# Patient Record
Sex: Female | Born: 1990 | Hispanic: Yes | Marital: Single | Smoking: Current every day smoker
Health system: Southern US, Community
[De-identification: ages and names within clinical notes are randomized; demographics above are authoritative.]

---

## 2021-04-21 ENCOUNTER — Other Ambulatory Visit: Payer: Self-pay

## 2021-04-21 ENCOUNTER — Encounter: Payer: Self-pay | Admitting: Emergency Medicine

## 2021-04-21 DIAGNOSIS — F17210 Nicotine dependence, cigarettes, uncomplicated: Secondary | ICD-10-CM | POA: Insufficient documentation

## 2021-04-21 DIAGNOSIS — L03115 Cellulitis of right lower limb: Secondary | ICD-10-CM | POA: Insufficient documentation

## 2021-04-21 DIAGNOSIS — D649 Anemia, unspecified: Secondary | ICD-10-CM | POA: Insufficient documentation

## 2021-04-21 LAB — BASIC METABOLIC PANEL
Anion gap: 7 (ref 5–15)
BUN: 17 mg/dL (ref 6–20)
CO2: 27 mmol/L (ref 22–32)
Calcium: 9 mg/dL (ref 8.9–10.3)
Chloride: 102 mmol/L (ref 98–111)
Creatinine, Ser: 0.71 mg/dL (ref 0.44–1.00)
GFR, Estimated: >60 mL/min (ref >60)
Glucose, Bld: 108 mg/dL — ABNORMAL HIGH (ref 70–99)
Potassium: 4.2 mmol/L (ref 3.5–5.1)
Sodium: 136 mmol/L (ref 135–145)

## 2021-04-21 LAB — CBC
HCT: 26.1 % — ABNORMAL LOW (ref 36.0–46.0)
Hemoglobin: 7.1 g/dL — ABNORMAL LOW (ref 12.0–15.0)
MCH: 16.4 pg — ABNORMAL LOW (ref 26.0–34.0)
MCHC: 27.2 g/dL — ABNORMAL LOW (ref 30.0–36.0)
MCV: 60.4 fL — ABNORMAL LOW (ref 80.0–100.0)
Platelets: 302 10*3/uL (ref 150–400)
RBC: 4.32 MIL/uL (ref 3.87–5.11)
RDW: 18.6 % — ABNORMAL HIGH (ref 11.5–15.5)
WBC: 6.7 10*3/uL (ref 4.0–10.5)
nRBC: 0 % (ref 0.0–0.2)

## 2021-04-21 NOTE — ED Triage Notes (Signed)
Pt to ED from home c/o right leg swelling x4 days.  States redness but dry.  States swelling to bilateral legs in the past.  Right leg more swollen than left and redness noted to lower leg.

## 2021-04-22 ENCOUNTER — Emergency Department: Payer: Self-pay

## 2021-04-22 ENCOUNTER — Emergency Department
Admission: EM | Admit: 2021-04-22 | Discharge: 2021-04-22 | Disposition: A | Discharge status: Home / Self Care | Payer: Self-pay | Attending: Emergency Medicine | Admitting: Emergency Medicine

## 2021-04-22 DIAGNOSIS — M7989 Other specified soft tissue disorders: Secondary | ICD-10-CM

## 2021-04-22 DIAGNOSIS — D649 Anemia, unspecified: Secondary | ICD-10-CM

## 2021-04-22 DIAGNOSIS — L03115 Cellulitis of right lower limb: Secondary | ICD-10-CM

## 2021-04-22 LAB — LACTIC ACID, PLASMA: Lactic Acid, Venous: 0.7 mmol/L (ref 0.5–1.9)

## 2021-04-22 MED ORDER — SODIUM CHLORIDE 0.9 % IV SOLN
100.0000 mg | Freq: Once | INTRAVENOUS | Status: AC
  Administered 2021-04-22: 04:00:00 100 mg via INTRAVENOUS
  Filled 2021-04-22 (×2): qty 100

## 2021-04-22 MED ORDER — DOXYCYCLINE HYCLATE 50 MG PO CAPS: 100.0000 mg | ORAL_CAPSULE | Freq: Two times a day (BID) | ORAL | 0 refills | Status: AC

## 2021-04-22 NOTE — ED Provider Notes (Signed)
[facility] Emergency Department Provider Note   ____________________________________________   Event Date/Time   First MD Initiated Contact with Patient 04/22/21 0100     (approximate)  I have reviewed the triage vital signs and the nursing notes.   HISTORY  Chief Complaint Leg Swelling    HPI Stacy Mullen is a 30 y.o. female who presents to the ED from home with a chief complaint of right leg redness and swelling x4 days.  Denies fever, chills, cough, chest pain, shortness of breath, abdominal pain, nausea, vomiting or dizziness.  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  She last injected yesterday morning to her right posterior thigh.  States redness began beneath her big toe 4 days ago.  Thinks either something bit her or maybe she dropped something on it but denies known trauma or injury.  Redness and swelling then started in her foot and spread upwards towards her calf.      Past medical history None  There are no problems to display for this patient.   History reviewed. No pertinent surgical history.  Prior to Admission medications   Medication Sig Start Date End Date Taking? Authorizing Provider  doxycycline (VIBRAMYCIN) 50 MG capsule Take 2 capsules (100 mg total) by mouth 2 (two) times daily. 04/22/21  Yes Irean Hong, MD    Allergies Patient has no known allergies.  History reviewed. No pertinent family history.  Social History Social History   Tobacco Use   Smoking status: Every Day    Packs/day: 0.50    Types: Cigarettes   Smokeless tobacco: Never  Substance Use Topics   Alcohol use: Never   Drug use: Yes    Types: IV    Comment: Heroin last used morning 04/21/21    Review of Systems  Constitutional: No fever/chills Eyes: No visual changes. ENT: No sore throat. Cardiovascular: Denies chest pain. Respiratory: Denies shortness of breath. Gastrointestinal: No abdominal pain.  No  nausea, no vomiting.  No diarrhea.  No constipation. Genitourinary: Negative for dysuria. Musculoskeletal: Positive for right leg swelling and redness.  Negative for back pain. Skin: Negative for rash. Neurological: Negative for headaches, focal weakness or numbness.   ____________________________________________   PHYSICAL EXAM:  VITAL SIGNS: ED Triage Vitals  Enc Vitals Group     BP 04/21/21 2225 133/76     Pulse Rate 04/21/21 2225 (!) 115     Resp 04/21/21 2225 16     Temp 04/21/21 2225 98.5 F (36.9 C)     Temp Source 04/21/21 2225 Oral     SpO2 04/21/21 2225 100 %     Weight 04/21/21 2227 220 lb (99.8 kg)     Height 04/21/21 2227 5\' 7"  (1.702 m)     Head Circumference --      Peak Flow --      Pain Score 04/21/21 2227 8     Pain Loc --      Pain Edu? --      Excl. in GC? --     Constitutional: Alert and oriented. Well appearing and in no acute distress. Eyes: Conjunctivae are normal. PERRL. EOMI. Head: Atraumatic. Nose: No congestion/rhinnorhea. Mouth/Throat: Mucous membranes are moist.   Neck: No stridor.   Cardiovascular: Normal rate, regular rhythm. Grossly normal heart sounds.  Good peripheral circulation. Respiratory: Normal respiratory effort.  No retractions. Lungs CTAB. Gastrointestinal: Soft and nontender to light or deep palpation. No distention. No abdominal bruits. No  CVA tenderness. Musculoskeletal:  RLE: Small contusion noted to dorsal first metatarsal without deformity.  Redness and swelling to right dorsal foot and calf.  2+ distal pulses.  Brisk, less than 5-second capillary refill.  Right posterior thigh near bend of knee with bruising from IVDA but no fluctuance concerning for abscess. Neurologic:  Normal speech and language. No gross focal neurologic deficits are appreciated. No gait instability. Skin:  Skin is warm, dry and intact. No rash noted. Psychiatric: Mood and affect are normal. Speech and behavior are  normal.  ____________________________________________   LABS (all labs ordered are listed, but only abnormal results are displayed)  Labs Reviewed  CBC - Abnormal; Notable for the following components:      Result Value   Hemoglobin 7.1 (*)    HCT 26.1 (*)    MCV 60.4 (*)    MCH 16.4 (*)    MCHC 27.2 (*)    RDW 18.6 (*)    All other components within normal limits  BASIC METABOLIC PANEL - Abnormal; Notable for the following components:   Glucose, Bld 108 (*)    All other components within normal limits  CULTURE, BLOOD (ROUTINE X 2)  CULTURE, BLOOD (ROUTINE X 2)  LACTIC ACID, PLASMA   ____________________________________________  EKG  None ____________________________________________  RADIOLOGY I, Marina Boerner J, personally viewed and evaluated these images (plain radiographs) as part of my medical decision making, as well as reviewing the written report by the radiologist.  ED MD interpretation: No osseous injury to right foot; negative DVT US  Official radiology report(s): US Venous Img Lower Unilateral Right  Result Date: 04/22/2021 CLINICAL DATA:  Right leg swelling EXAM: RIGHT LOWER EXTREMITY VENOUS DOPPLER ULTRASOUND TECHNIQUE: Gray-scale sonography with compression, as well as color and duplex ultrasound, were performed to evaluate the deep venous system(s) from the level of the common femoral vein through the popliteal and proximal calf veins. COMPARISON:  None. FINDINGS: VENOUS Normal compressibility of the common femoral, superficial femoral, and popliteal veins, as well as the visualized calf veins. Visualized portions of profunda femoral vein and great saphenous vein unremarkable. No filling defects to suggest DVT on grayscale or color Doppler imaging. Doppler waveforms show normal direction of venous flow, normal respiratory plasticity and response to augmentation. Limited views of the contralateral common femoral vein are unremarkable. OTHER None. Limitations: none  IMPRESSION: Negative. Electronically Signed   By: Helyn Numbers M.D.   On: 04/22/2021 03:44   DG Foot Complete Right  Result Date: 04/22/2021 CLINICAL DATA:  Bruising of the first metatarsal. EXAM: RIGHT FOOT COMPLETE - 3+ VIEW COMPARISON:  None. FINDINGS: There is no evidence of fracture or dislocation. Mild to moderate severity degenerative changes are seen along the dorsal aspect of the mid right foot. Marked severity soft tissue swelling is seen, most notably along the dorsal aspect of the right foot. IMPRESSION: Marked severity soft tissue swelling without evidence of acute fracture. Electronically Signed   By: Aram Candela M.D.   On: 04/22/2021 02:33    ____________________________________________   PROCEDURES  Procedure(s) performed (including Critical Care):  Procedures   ____________________________________________   INITIAL IMPRESSION / ASSESSMENT AND PLAN / ED COURSE  As part of my medical decision making, I reviewed the following data within the electronic MEDICAL RECORD NUMBER Nursing notes reviewed and incorporated, old records reviewed, labs reviewed, and Notes from prior ED visits     30 year old female presenting with a 4-day history of right lower leg redness and swelling.  History of IVDA.  Differential diagnosis includes but is not limited to abscess, cellulitis, DVT, etc.  Laboratory results notable for anemia.  Patient has known anemia and is supposed to take iron tablets but does not.  States she was last seen in [place] and referred to a hematologist but she never followed up.  There are no old records for comparison of prior H/H and patient does not know this.  There is no active hemorrhage currently; suspect this is chronic anemia and will refer to hematology for outpatient work-up  Clinical Course as of 04/22/21 0400  Wed Apr 22, 2021  0355 Negative DVT ultrasound.  Lactic acid normal.  Patient feeling fine.  Will discharge home on doxycycline and  patient will follow up with her PCP.  Will refer to hematology for outpatient anemia work-up.  Encouraged patient to resume her iron tablets daily.  Strict return precautions given.  Patient verbalizes understanding and agrees with plan of care. [JS]    Clinical Course User Index [JS] Irean Hong, MD     ____________________________________________   FINAL CLINICAL IMPRESSION(S) / ED DIAGNOSES  Final diagnoses:  Right leg swelling  Cellulitis of right lower extremity  Anemia, unspecified type     ED Discharge Orders          Ordered    doxycycline (VIBRAMYCIN) 50 MG capsule  2 times daily        04/22/21 0357             Note:  This document was prepared using Dragon voice recognition software and may include unintentional dictation errors.    Irean Hong, MD 04/22/21 [phone]

## 2021-04-22 NOTE — Discharge Instructions (Signed)
1.  Take and finish antibiotic as prescribed (Doxycycline 100mg  twice daily x7 days). 2.  Restart daily iron supplements. 3.  Return to the ER for worsening symptoms, persistent vomiting, difficulty breathing or other concerns.

## 2021-04-22 NOTE — ED Notes (Addendum)
Presents to the ER with R leg swelling that started 4 days ago. Pt admits to using IV drugs. Denies using any veins in the leg. Pt states that it was noticeable after she hit her toe. R leg swelling, edematous. Red and warm to touch. Denies chest pain and SOB. Pt states her last use of Heroine was this morning. Pain 0/10 at rest. Pain with movement, walking 8/10. Calm and cooperative at this time. Skin p/w/d. RR even and nonlabored.   Hx of anemia, states that she has not been taking her iron supplements.

## 2021-04-27 LAB — CULTURE, BLOOD (ROUTINE X 2)
Culture: NO GROWTH
Culture: NO GROWTH
Special Requests: ADEQUATE

## 2023-05-15 IMAGING — DX DG FOOT COMPLETE 3+V*R*
3 series · 3 of 3 positions shown · non-contrast
Comparison: None.

CLINICAL DATA: Bruising of the first metatarsal.

EXAM:
RIGHT FOOT COMPLETE - 3+ VIEW

[foot ap]
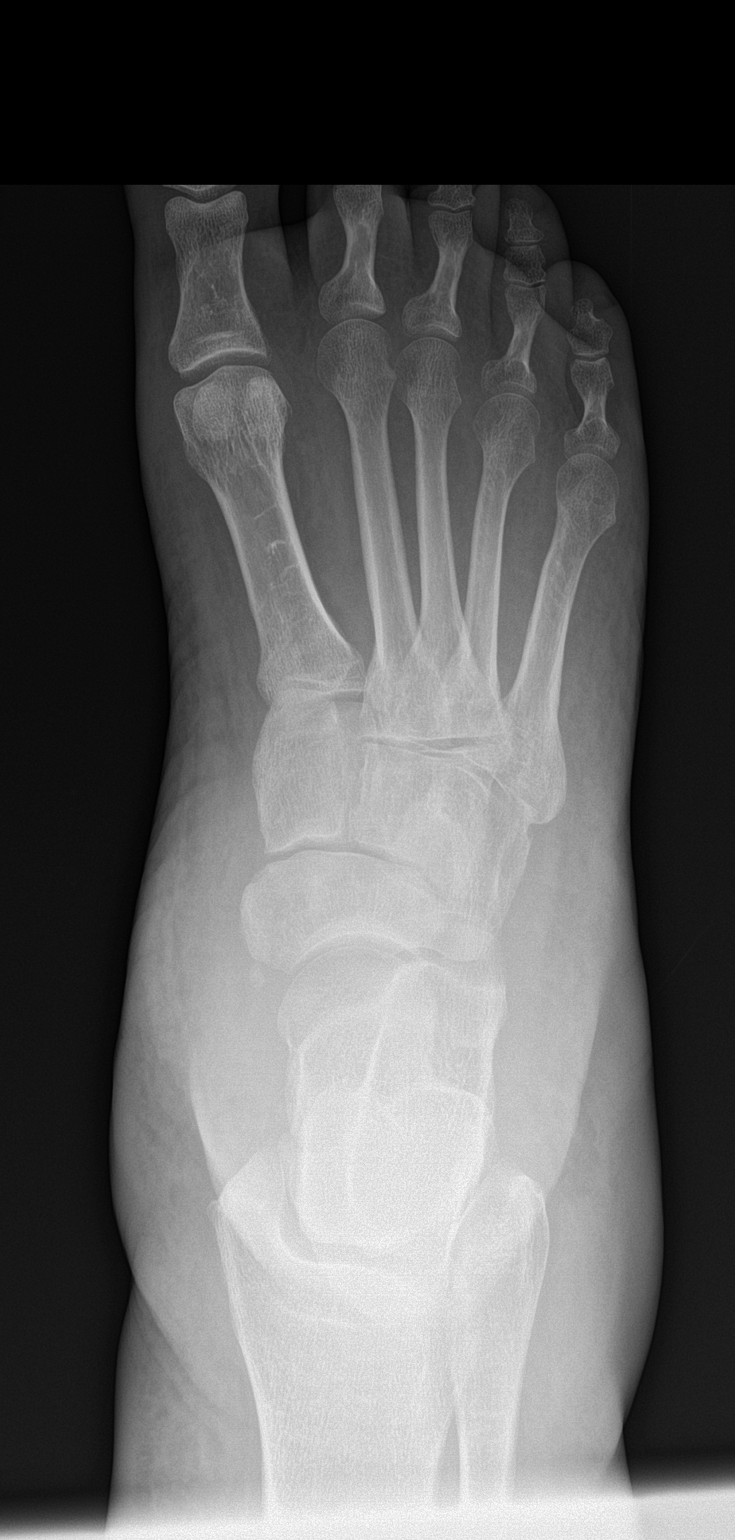

[foot obl]
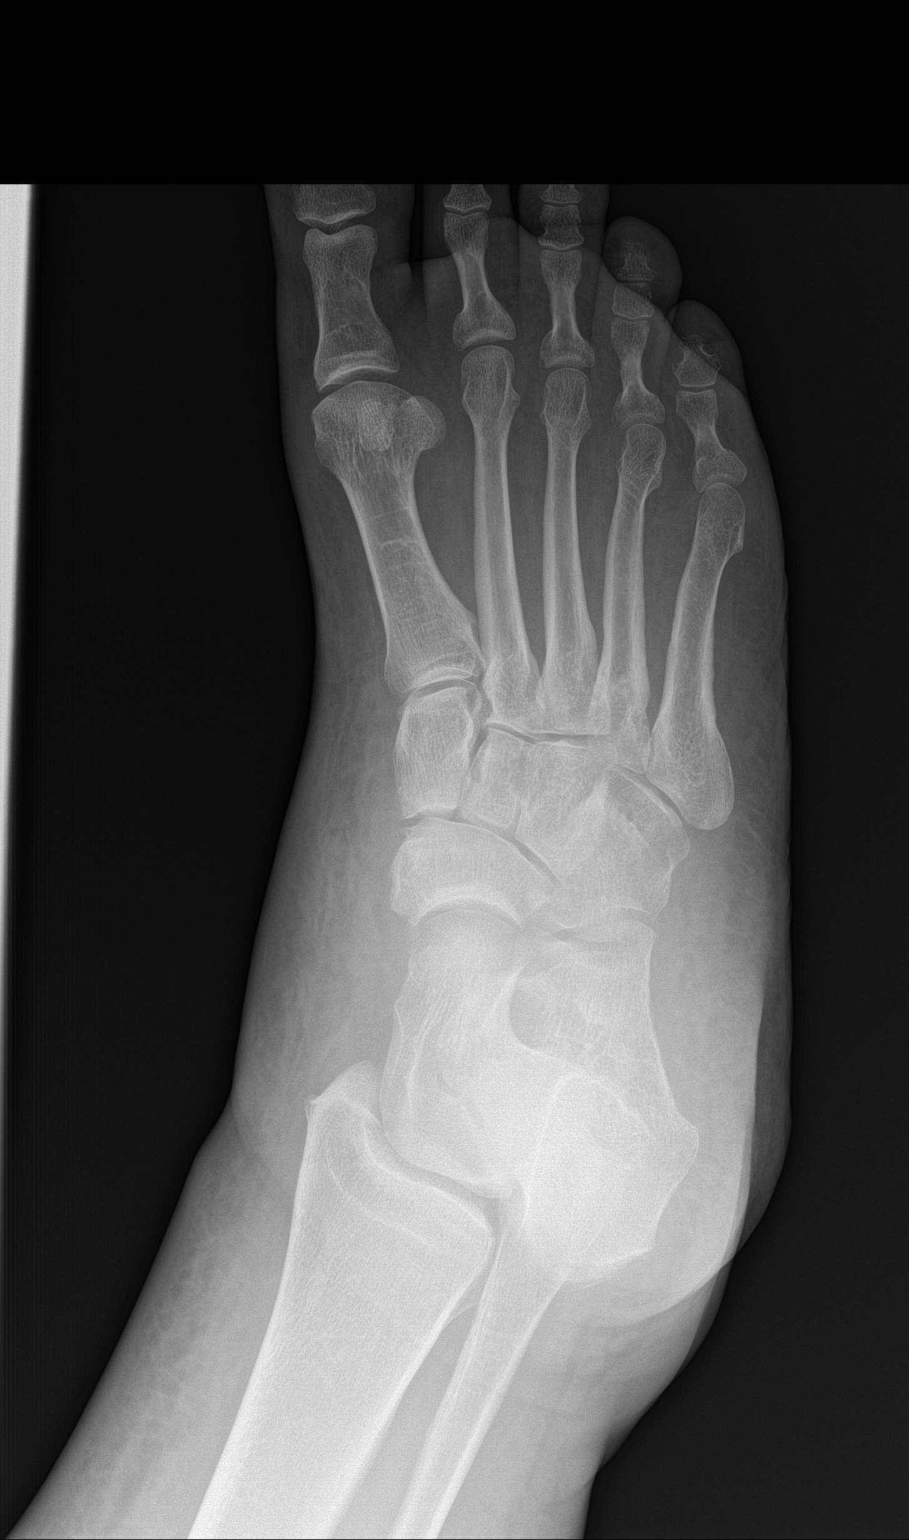

[foot lat]
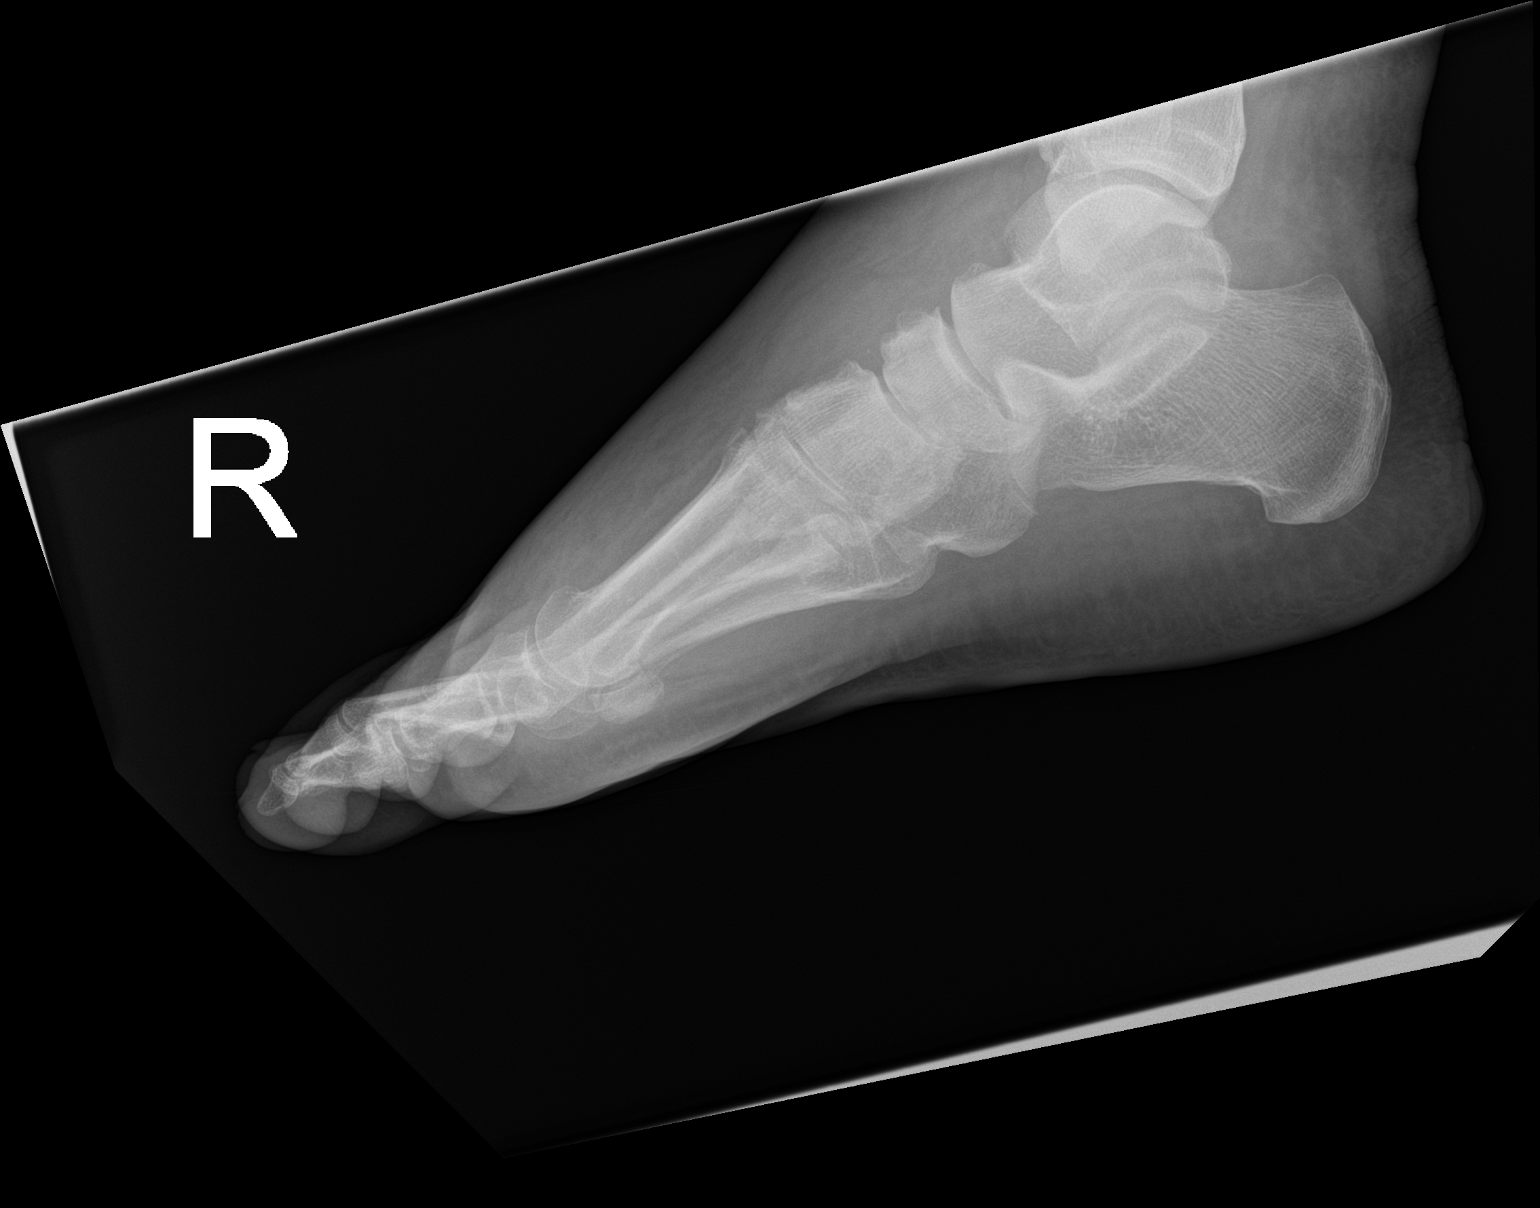

[3 of 3 positions shown; findings below may reference images not displayed]

FINDINGS: There is no evidence of fracture or dislocation. Mild to moderate
severity degenerative changes are seen along the dorsal aspect of
the mid right foot. Marked severity soft tissue swelling is seen,
most notably along the dorsal aspect of the right foot.
IMPRESSION: Marked severity soft tissue swelling without evidence of acute
fracture.
